# Patient Record
Sex: Female | Born: 1980 | Race: White | Hispanic: No | Marital: Married | Smoking: Never smoker
Health system: Southern US, Community
[De-identification: ages and names within clinical notes are randomized; demographics above are authoritative.]

## PROBLEM LIST (undated history)

## (undated) DIAGNOSIS — R519 Headache, unspecified: Secondary | ICD-10-CM

## (undated) DIAGNOSIS — F419 Anxiety disorder, unspecified: Secondary | ICD-10-CM

## (undated) DIAGNOSIS — R51 Headache: Secondary | ICD-10-CM

## (undated) HISTORY — PX: OTHER SURGICAL HISTORY: SHX169

## (undated) HISTORY — PX: EYE SURGERY: SHX253

---

## 2004-11-05 ENCOUNTER — Other Ambulatory Visit: Admission: RE | Admit: 2004-11-05 | Discharge: 2004-11-05 | Payer: Self-pay | Admitting: Obstetrics and Gynecology

## 2005-02-16 ENCOUNTER — Emergency Department (HOSPITAL_COMMUNITY): Admission: EM | Admit: 2005-02-16 | Discharge: 2005-02-16 | Payer: Self-pay | Admitting: Emergency Medicine

## 2005-12-07 ENCOUNTER — Other Ambulatory Visit: Admission: RE | Admit: 2005-12-07 | Discharge: 2005-12-07 | Payer: Self-pay | Admitting: Obstetrics and Gynecology

## 2013-12-04 ENCOUNTER — Ambulatory Visit: Payer: Self-pay | Admitting: Internal Medicine

## 2015-09-10 ENCOUNTER — Other Ambulatory Visit (HOSPITAL_COMMUNITY): Payer: Self-pay | Admitting: Obstetrics & Gynecology

## 2015-09-10 DIAGNOSIS — Z3169 Encounter for other general counseling and advice on procreation: Secondary | ICD-10-CM

## 2015-09-18 ENCOUNTER — Ambulatory Visit (HOSPITAL_COMMUNITY)
Admission: RE | Admit: 2015-09-18 | Discharge: 2015-09-18 | Disposition: A | Discharge status: Home / Self Care | Payer: 59 | Source: Ambulatory Visit | Attending: Obstetrics & Gynecology | Admitting: Obstetrics & Gynecology

## 2015-09-18 DIAGNOSIS — Z3169 Encounter for other general counseling and advice on procreation: Secondary | ICD-10-CM | POA: Diagnosis present

## 2015-09-18 MED ORDER — IOPAMIDOL (ISOVUE-300) INJECTION 61%
30.0000 mL | Freq: Once | INTRAVENOUS | Status: AC | PRN
  Administered 2015-09-18: 30 mL

## 2015-10-23 ENCOUNTER — Encounter (HOSPITAL_COMMUNITY): Payer: Self-pay | Admitting: *Deleted

## 2015-10-27 ENCOUNTER — Other Ambulatory Visit: Payer: Self-pay | Admitting: Obstetrics & Gynecology

## 2015-11-04 ENCOUNTER — Other Ambulatory Visit: Payer: Self-pay | Admitting: Obstetrics & Gynecology

## 2015-11-11 ENCOUNTER — Ambulatory Visit (HOSPITAL_COMMUNITY): Payer: 59 | Admitting: Anesthesiology

## 2015-11-11 ENCOUNTER — Ambulatory Visit (HOSPITAL_COMMUNITY)
Admission: RE | Admit: 2015-11-11 | Discharge: 2015-11-11 | Disposition: A | Discharge status: Home / Self Care | Payer: 59 | Source: Ambulatory Visit | Attending: Obstetrics & Gynecology | Admitting: Obstetrics & Gynecology

## 2015-11-11 ENCOUNTER — Encounter (HOSPITAL_COMMUNITY)
Admission: RE | Disposition: A | Discharge status: Home / Self Care | Payer: Self-pay | Source: Ambulatory Visit | Attending: Obstetrics & Gynecology

## 2015-11-11 ENCOUNTER — Encounter (HOSPITAL_COMMUNITY): Payer: Self-pay

## 2015-11-11 DIAGNOSIS — F419 Anxiety disorder, unspecified: Secondary | ICD-10-CM | POA: Insufficient documentation

## 2015-11-11 DIAGNOSIS — Z91040 Latex allergy status: Secondary | ICD-10-CM | POA: Insufficient documentation

## 2015-11-11 DIAGNOSIS — Q512 Other doubling of uterus: Secondary | ICD-10-CM | POA: Diagnosis not present

## 2015-11-11 DIAGNOSIS — N840 Polyp of corpus uteri: Secondary | ICD-10-CM | POA: Diagnosis present

## 2015-11-11 DIAGNOSIS — N979 Female infertility, unspecified: Secondary | ICD-10-CM | POA: Diagnosis not present

## 2015-11-11 DIAGNOSIS — Q513 Bicornate uterus: Secondary | ICD-10-CM | POA: Diagnosis not present

## 2015-11-11 DIAGNOSIS — Q51810 Arcuate uterus: Secondary | ICD-10-CM | POA: Insufficient documentation

## 2015-11-11 HISTORY — DX: Headache, unspecified: R51.9

## 2015-11-11 HISTORY — DX: Headache: R51

## 2015-11-11 HISTORY — DX: Anxiety disorder, unspecified: F41.9

## 2015-11-11 HISTORY — PX: DILATATION & CURETTAGE/HYSTEROSCOPY WITH MYOSURE: SHX6511

## 2015-11-11 LAB — CBC
HEMATOCRIT: 39.3 % (ref 36.0–46.0)
Hemoglobin: 13 g/dL (ref 12.0–15.0)
MCH: 28.8 pg (ref 26.0–34.0)
MCHC: 33.1 g/dL (ref 30.0–36.0)
MCV: 86.9 fL (ref 78.0–100.0)
PLATELETS: 254 10*3/uL (ref 150–400)
RBC: 4.52 MIL/uL (ref 3.87–5.11)
RDW: 12.5 % (ref 11.5–15.5)
WBC: 6.2 10*3/uL (ref 4.0–10.5)

## 2015-11-11 LAB — HCG, SERUM, QUALITATIVE: Preg, Serum: NEGATIVE

## 2015-11-11 LAB — PREGNANCY, URINE: PREG TEST UR: NEGATIVE

## 2015-11-11 SURGERY — DILATATION & CURETTAGE/HYSTEROSCOPY WITH MYOSURE
Anesthesia: General

## 2015-11-11 MED ORDER — FENTANYL CITRATE (PF) 100 MCG/2ML IJ SOLN
INTRAMUSCULAR | Status: AC
  Filled 2015-11-11: qty 2

## 2015-11-11 MED ORDER — LIDOCAINE HCL 1 % IJ SOLN
INTRAMUSCULAR | Status: AC
  Filled 2015-11-11: qty 20

## 2015-11-11 MED ORDER — LIDOCAINE HCL (CARDIAC) 20 MG/ML IV SOLN
INTRAVENOUS | Status: AC
  Filled 2015-11-11: qty 5

## 2015-11-11 MED ORDER — ONDANSETRON HCL 4 MG/2ML IJ SOLN
INTRAMUSCULAR | Status: AC
  Filled 2015-11-11: qty 2

## 2015-11-11 MED ORDER — LIDOCAINE HCL (CARDIAC) 20 MG/ML IV SOLN
INTRAVENOUS | Status: DC | PRN
  Administered 2015-11-11: 70 mg via INTRAVENOUS
  Administered 2015-11-11: 30 mg via INTRAVENOUS

## 2015-11-11 MED ORDER — LACTATED RINGERS IV SOLN
INTRAVENOUS | Status: DC
  Administered 2015-11-11: 14:00:00 via INTRAVENOUS
  Administered 2015-11-11: 125 mL/h via INTRAVENOUS

## 2015-11-11 MED ORDER — FLUMAZENIL 0.5 MG/5ML IV SOLN
INTRAVENOUS | Status: DC | PRN
  Administered 2015-11-11: 0.2 mg via INTRAVENOUS

## 2015-11-11 MED ORDER — SCOPOLAMINE 1 MG/3DAYS TD PT72
1.0000 | MEDICATED_PATCH | TRANSDERMAL | Status: DC
  Administered 2015-11-11: 1.5 mg via TRANSDERMAL

## 2015-11-11 MED ORDER — EPHEDRINE 5 MG/ML INJ
INTRAVENOUS | Status: AC
  Filled 2015-11-11: qty 10

## 2015-11-11 MED ORDER — IBUPROFEN 200 MG PO TABS
200.0000 mg | ORAL_TABLET | Freq: Four times a day (QID) | ORAL | Status: DC | PRN
  Filled 2015-11-11: qty 2

## 2015-11-11 MED ORDER — FENTANYL CITRATE (PF) 250 MCG/5ML IJ SOLN
INTRAMUSCULAR | Status: AC
  Filled 2015-11-11: qty 5

## 2015-11-11 MED ORDER — IBUPROFEN 100 MG/5ML PO SUSP
200.0000 mg | Freq: Four times a day (QID) | ORAL | Status: DC | PRN
  Filled 2015-11-11: qty 20

## 2015-11-11 MED ORDER — MIDAZOLAM HCL 2 MG/2ML IJ SOLN
INTRAMUSCULAR | Status: AC
  Filled 2015-11-11: qty 2

## 2015-11-11 MED ORDER — PROPOFOL 10 MG/ML IV BOLUS
INTRAVENOUS | Status: AC
  Filled 2015-11-11: qty 20

## 2015-11-11 MED ORDER — IBUPROFEN 200 MG PO TABS: 600.0000 mg | ORAL_TABLET | Freq: Four times a day (QID) | ORAL | Status: DC | PRN

## 2015-11-11 MED ORDER — FLUMAZENIL 0.5 MG/5ML IV SOLN
INTRAVENOUS | Status: AC
  Filled 2015-11-11: qty 5

## 2015-11-11 MED ORDER — HYDROCODONE-ACETAMINOPHEN 7.5-325 MG PO TABS
ORAL_TABLET | ORAL | Status: AC
  Filled 2015-11-11: qty 1

## 2015-11-11 MED ORDER — FENTANYL CITRATE (PF) 100 MCG/2ML IJ SOLN
INTRAMUSCULAR | Status: DC | PRN
  Administered 2015-11-11 (×4): 50 ug via INTRAVENOUS

## 2015-11-11 MED ORDER — KETOROLAC TROMETHAMINE 30 MG/ML IJ SOLN
INTRAMUSCULAR | Status: DC | PRN
  Administered 2015-11-11: 30 mg via INTRAVENOUS

## 2015-11-11 MED ORDER — FENTANYL CITRATE (PF) 100 MCG/2ML IJ SOLN
25.0000 ug | INTRAMUSCULAR | Status: DC | PRN
  Administered 2015-11-11: 50 ug via INTRAVENOUS

## 2015-11-11 MED ORDER — MIDAZOLAM HCL 2 MG/2ML IJ SOLN
INTRAMUSCULAR | Status: DC | PRN
  Administered 2015-11-11: 1 mg via INTRAVENOUS

## 2015-11-11 MED ORDER — LIDOCAINE HCL 1 % IJ SOLN
INTRAMUSCULAR | Status: DC | PRN
  Administered 2015-11-11: 20 mL

## 2015-11-11 MED ORDER — MEPERIDINE HCL 25 MG/ML IJ SOLN: 6.2500 mg | INTRAMUSCULAR | Status: DC | PRN

## 2015-11-11 MED ORDER — ONDANSETRON HCL 4 MG/2ML IJ SOLN: 4.0000 mg | Freq: Once | INTRAMUSCULAR | Status: DC | PRN

## 2015-11-11 MED ORDER — SCOPOLAMINE 1 MG/3DAYS TD PT72
MEDICATED_PATCH | TRANSDERMAL | Status: AC
  Administered 2015-11-11: 1.5 mg via TRANSDERMAL
  Filled 2015-11-11: qty 1

## 2015-11-11 MED ORDER — ONDANSETRON HCL 4 MG/2ML IJ SOLN
INTRAMUSCULAR | Status: DC | PRN
  Administered 2015-11-11: 4 mg via INTRAVENOUS

## 2015-11-11 MED ORDER — DEXAMETHASONE SODIUM PHOSPHATE 10 MG/ML IJ SOLN
INTRAMUSCULAR | Status: DC | PRN
  Administered 2015-11-11: 4 mg via INTRAVENOUS

## 2015-11-11 MED ORDER — EPHEDRINE SULFATE 50 MG/ML IJ SOLN
INTRAMUSCULAR | Status: DC | PRN
  Administered 2015-11-11: 10 mg via INTRAVENOUS
  Administered 2015-11-11: 15 mg via INTRAVENOUS
  Administered 2015-11-11: 10 mg via INTRAVENOUS

## 2015-11-11 MED ORDER — PROPOFOL 10 MG/ML IV BOLUS
INTRAVENOUS | Status: DC | PRN
  Administered 2015-11-11: 180 mg via INTRAVENOUS

## 2015-11-11 MED ORDER — SODIUM CHLORIDE 0.9 % IR SOLN
Status: DC | PRN
  Administered 2015-11-11: 3000 mL

## 2015-11-11 MED ORDER — KETOROLAC TROMETHAMINE 30 MG/ML IJ SOLN: 30.0000 mg | Freq: Once | INTRAMUSCULAR | Status: DC

## 2015-11-11 MED ORDER — HYDROCODONE-ACETAMINOPHEN 7.5-325 MG PO TABS
1.0000 | ORAL_TABLET | Freq: Once | ORAL | Status: AC | PRN
  Administered 2015-11-11: 1 via ORAL

## 2015-11-11 SURGICAL SUPPLY — 18 items
CANISTER SUCT 3000ML (MISCELLANEOUS) ×2 IMPLANT
CATH ROBINSON RED A/P 16FR (CATHETERS) ×2 IMPLANT
CLOTH BEACON ORANGE TIMEOUT ST (SAFETY) ×2 IMPLANT
CONTAINER PREFILL 10% NBF 60ML (FORM) ×4 IMPLANT
DEVICE MYOSURE LITE (MISCELLANEOUS) IMPLANT
DEVICE MYOSURE REACH (MISCELLANEOUS) ×1 IMPLANT
FILTER ARTHROSCOPY CONVERTOR (FILTER) ×2 IMPLANT
GLOVE BIO SURGEON STRL SZ7 (GLOVE) ×2 IMPLANT
GLOVE BIOGEL PI IND STRL 7.0 (GLOVE) ×3 IMPLANT
GLOVE BIOGEL PI INDICATOR 7.0 (GLOVE) ×3
GOWN STRL REUS W/TWL LRG LVL3 (GOWN DISPOSABLE) ×4 IMPLANT
PACK VAGINAL MINOR WOMEN LF (CUSTOM PROCEDURE TRAY) ×2 IMPLANT
PAD OB MATERNITY 4.3X12.25 (PERSONAL CARE ITEMS) ×2 IMPLANT
SEAL ROD LENS SCOPE MYOSURE (ABLATOR) ×2 IMPLANT
TOWEL OR 17X24 6PK STRL BLUE (TOWEL DISPOSABLE) ×4 IMPLANT
TUBING AQUILEX INFLOW (TUBING) ×2 IMPLANT
TUBING AQUILEX OUTFLOW (TUBING) ×2 IMPLANT
WATER STERILE IRR 1000ML POUR (IV SOLUTION) ×2 IMPLANT

## 2015-11-11 NOTE — Op Note (Signed)
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 Patient was counseled on risks/ complications including infection, bleeding, damage to internal organs, she understood and agrees, gave informed written consent.  Patient was brought to the operating room with IV running. Time out was carried out. She received preop 1 gm Ancef. She underwent general anesthesia via LMA without complications. She was given dorsolithotomy position. Parts were prepped and draped in standard fashion. Bladder was catheterized once. Bimanual exam revealed uterus to be anteverted and normal size. Speculum was placed and cervix was grasped with single-tooth tenaculum. Cervical block with 20 cc 1% plain Xylocaine given. The uterus was sounded to 7 cm. Cervical os was dilated to 21 [place] dilator. Myosure Hysteroscope was introduced in the uterine cavity under vision, using saline for irrigation.  Findings: Lower segment and left mid cavity lateral polyp. 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 Procedure well tolerated. Specimen sent to path. Hysteroscope and all instruments removed. Silver nitrate applied at tenaculum site.  Fluid deficit 280 cc.    All counts are correct x2. Patient awoke from anesthesia and brought to the recovery room stable.   Discharge from PACU, Follow up in 2 weeks in office. Warning signs of infection and excessive bleeding reviewed.   I performed the surgery. V.Lee-Ann Gal, MD  V.Benjie Karvonen, MD.

## 2015-11-11 NOTE — Discharge Instructions (Addendum)
DISCHARGE INSTRUCTIONS: HYSTEROSCOPY / ENDOMETRIAL ABLATION The following instructions have been prepared to help you care for yourself upon your return home.  May Remove Scop patch on or before: Friday November 14, 2015   May take Ibuprofen after: 7:50  May take stool softner while taking narcotic pain medication to prevent constipation.  Drink plenty of water.  Personal hygiene:  Use sanitary pads for vaginal drainage, not tampons.  Shower the day after your procedure.  NO tub baths, pools or Jacuzzis for 2-3 weeks.  Wipe front to back after using the bathroom.  Activity and limitations:  Do NOT drive or operate any equipment for 24 hours. The effects of anesthesia are still present and drowsiness may result.  Do NOT rest in bed all day.  Walking is encouraged.  Walk up and down stairs slowly.  You may resume your normal activity in one to two days or as indicated by your physician. Sexual activity: NO intercourse for at least 2 weeks after the procedure, or as indicated by your Doctor.  Diet: Eat a light meal as desired this evening. You may resume your usual diet tomorrow.  Return to Work: You may resume your work activities in one to two days or as indicated by Marine scientist.  What to expect after your surgery: Expect to have vaginal bleeding/discharge for 2-3 days and spotting for up to 10 days. It is not unusual to have soreness for up to 1-2 weeks. You may have a slight burning sensation when you urinate for the first day. Mild cramps may continue for a couple of days. You may have a regular period in 2-6 weeks.  Call your doctor for any of the following:  Excessive vaginal bleeding or clotting, saturating and changing one pad every hour.  Inability to urinate 6 hours after discharge from hospital.  Pain not relieved by pain medication.  Fever of 100.4 F or greater.  Unusual vaginal discharge or odor.  Return to office _________________Call for an appointment  ___________________ Patients signature: ______________________ Nurses signature ________________________  Post Anesthesia Care Unit [phone] Post Anesthesia Home Care Instructions  Activity: Get plenty of rest for the remainder of the day. A responsible adult should stay with you for 24 hours following the procedure.  For the next 24 hours, DO NOT: -Drive a car -Paediatric nurse -Drink alcoholic beverages -Take any medication unless instructed by your physician -Make any legal decisions or sign important papers.  Meals: Start with liquid foods such as gelatin or soup. Progress to regular foods as tolerated. Avoid greasy, spicy, heavy foods. If nausea and/or vomiting occur, drink only clear liquids until the nausea and/or vomiting subsides. Call your physician if vomiting continues.  Special Instructions/Symptoms: Your throat may feel dry or sore from the anesthesia or the breathing tube placed in your throat during surgery. If this causes discomfort, gargle with warm salt water. The discomfort should disappear within 24 hours.  If you had a scopolamine patch placed behind your ear for the management of post- operative nausea and/or vomiting:  1. The medication in the patch is effective for 72 hours, after which it should be removed.  Wrap patch in a tissue and discard in the trash. Wash hands thoroughly with soap and water. 2. You may remove the patch earlier than 72 hours if you experience unpleasant side effects which may include dry mouth, dizziness or visual disturbances. 3. Avoid touching the patch. Wash your hands with soap and water after contact with the patch.

## 2015-11-11 NOTE — Anesthesia Preprocedure Evaluation (Signed)
Anesthesia Evaluation  Patient identified by MRN, date of birth, ID band Patient awake    Reviewed: Allergy & Precautions, H&P , NPO status , Patient's Chart, lab work & pertinent test results  Airway Mallampati: I  TM Distance: >3 FB Neck ROM: full    Dental  (+) Teeth Intact   Pulmonary neg pulmonary ROS,    breath sounds clear to auscultation       Cardiovascular negative cardio ROS Normal cardiovascular exam     Neuro/Psych    GI/Hepatic negative GI ROS, Neg liver ROS,   Endo/Other  negative endocrine ROS  Renal/GU negative Renal ROS     Musculoskeletal   Abdominal Normal abdominal exam  (+)   Peds  Hematology negative hematology ROS (+)   Anesthesia Other Findings   Reproductive/Obstetrics negative OB ROS                             Anesthesia Physical Anesthesia Plan  ASA: II  Anesthesia Plan: General   Post-op Pain Management:    Induction: Intravenous  Airway Management Planned: LMA  Additional Equipment:   Intra-op Plan:   Post-operative Plan:   Informed Consent: I have reviewed the patients History and Physical, chart, labs and discussed the procedure including the risks, benefits and alternatives for the proposed anesthesia with the patient or authorized representative who has indicated his/her understanding and acceptance.     Plan Discussed with: CRNA and Surgeon  Anesthesia Plan Comments:         Anesthesia Quick Evaluation

## 2015-11-11 NOTE — H&P (Signed)
Lindsay Garza is an 35 y.o. female here for endometrial polypectomy.  Patient has infertility. Office work-up noted endometrial mass likely a polyp so recommended removal.  Normal Pap hx. No breast complaints.   Patient's last menstrual period was 10/08/2015 (exact date).    Past Medical History  Diagnosis Date  . Anxiety   . Headache     otc med prn    Past Surgical History  Procedure Laterality Date  . Urethra surgery      stretched  . Eye surgery Right     weak musle    History reviewed. No pertinent family history.  Social History:  reports that she has never smoked. She has never used smokeless tobacco. She reports that she drinks alcohol. She reports that she does not use illicit drugs.  Allergies:  Allergies  Allergen Reactions  . Latex Rash    Itching    Prescriptions prior to admission  Medication Sig Dispense Refill Last Dose  . naproxen (NAPROSYN) 500 MG tablet Take 500 mg by mouth daily as needed for moderate pain (cramps).    Past Week at Unknown time  . OVER THE COUNTER MEDICATION Take 2 each by mouth daily. Vita Fusion Gummies - 2 gummies daily   11/10/2015 at Unknown time  . clonazePAM (KLONOPIN) 0.5 MG tablet Take 0.5 mg by mouth as needed for anxiety.   More than a month at Unknown time    ROS neg  Blood pressure 102/74, pulse 72, temperature 98.2 F (36.8 C), temperature source Oral, resp. rate 20, height 5\' 5"  (1.651 m), weight 128 lb (58.06 kg), last menstrual period 10/08/2015, SpO2 100 %. Physical Exam Physical exam:  A&O x 3, no acute distress. Pleasant HEENT neg, no thyromegaly Lungs CTA bilat CV RRR, S1S2 normal Abdo soft, non tender, non acute Extr no edema/ tenderness Pelvic normal uterus and cervix.    Results for orders placed or performed during the hospital encounter of 11/11/15 (from the past 24 hour(s))  Pregnancy, urine     Status: None   Collection Time: 11/11/15 11:45 AM  Result Value Ref Range   Preg Test, Ur NEGATIVE  NEGATIVE  CBC     Status: None   Collection Time: 11/11/15 12:05 PM  Result Value Ref Range   WBC 6.2 4.0 - 10.5 K/uL   RBC 4.52 3.87 - 5.11 MIL/uL   Hemoglobin 13.0 12.0 - 15.0 g/dL   HCT 39.3 36.0 - 46.0 %   MCV 86.9 78.0 - 100.0 fL   MCH 28.8 26.0 - 34.0 pg   MCHC 33.1 30.0 - 36.0 g/dL   RDW 12.5 11.5 - 15.5 %   Platelets 254 150 - 400 K/uL    No results found.  Assessment/Plan: 35 yo, G0. Here for hysteroscopic polypectomy.  Risks/complications of surgery reviewed incl infection, bleeding, damage to uterus and scarring, damage to internal organs including bladder, bowels, ureters, blood vessels, other risks from anesthesia, VTE and delayed complications of any surgery, complications in future surgery reviewed.  UPT neg but pre-menses now Needs SPT- stat.   Lindsay Garza R 11/11/2015, 12:40 PM

## 2015-11-11 NOTE — Anesthesia Procedure Notes (Signed)
Procedure Name: LMA Insertion Date/Time: 11/11/2015 1:40 PM Performed by: Tobin Chad Pre-anesthesia Checklist: Patient identified, Timeout performed, Emergency Drugs available, Suction available and Patient being monitored Patient Re-evaluated:Patient Re-evaluated prior to inductionOxygen Delivery Method: Circle system utilized and Simple face mask Preoxygenation: Pre-oxygenation with 100% oxygen Intubation Type: IV induction Ventilation: Mask ventilation without difficulty LMA: LMA inserted LMA Size: 4.0 Grade View: Grade II Tube type: Oral Number of attempts: 1 Placement Confirmation: positive ETCO2 and CO2 detector Dental Injury: Teeth and Oropharynx as per pre-operative assessment

## 2015-11-11 NOTE — Transfer of Care (Signed)
Immediate Anesthesia Transfer of Care Note  Patient: Lindsay Garza  Procedure(s) Performed: Procedure(s): DILATATION & CURETTAGE/HYSTEROSCOPY WITH MYOSURE (N/A)  Patient Location: PACU  Anesthesia Type:General  Level of Consciousness: awake, alert , oriented and patient cooperative  Airway & Oxygen Therapy: Patient Spontanous Breathing and Patient connected to nasal cannula oxygen  Post-op Assessment: Report given to RN and Post -op Vital signs reviewed and stable  Post vital signs: Reviewed and stable  Last Vitals:  Filed Vitals:   11/11/15 1200  BP: 102/74  Pulse: 72  Temp: 36.8 C  Resp: 20    Last Pain: There were no vitals filed for this visit.    Patients Stated Pain Goal: 3 ([id number])  Complications: No apparent anesthesia complications

## 2015-11-12 ENCOUNTER — Encounter (HOSPITAL_COMMUNITY): Payer: Self-pay | Admitting: Obstetrics & Gynecology

## 2015-11-12 NOTE — Anesthesia Postprocedure Evaluation (Signed)
Anesthesia Post Note  Patient: Lindsay Garza  Procedure(s) Performed: Procedure(s) (LRB): DILATATION & CURETTAGE/HYSTEROSCOPY WITH MYOSURE (N/A)  Patient location during evaluation: PACU Anesthesia Type: General Level of consciousness: awake Pain management: pain level controlled Vital Signs Assessment: post-procedure vital signs reviewed and stable Respiratory status: spontaneous breathing Cardiovascular status: stable Postop Assessment: no signs of nausea or vomiting Anesthetic complications: no     Last Vitals:  Filed Vitals:   11/11/15 1500 11/11/15 1515  BP: 109/63   Pulse: 81   Temp:  36.8 C  Resp: 18     Last Pain:  Filed Vitals:   11/11/15 1521  PainSc: 3    Pain Goal: Patients Stated Pain Goal: 3 (11/11/15 1500)               Genever Hentges JR,JOHN Kavonte Bearse

## 2016-06-07 NOTE — L&D Delivery Note (Signed)
Delivery Note  First Stage: Labor onset: 11/28/16 @ 2100 Augmentation : none Analgesia Lorriane Shire intrapartum: Nitrous oxide, hydrotherapy SROM at 0544 am - clear fluid  Second Stage: Complete dilation at 0555 Onset of pushing at 0555 Intermittent fetal monitoring performed with FHR in 150s bpm before, during, and after contraction At crowning, occasional FHR 105-115 bpm with good return to 150s. Positive scalp stimulation.  In the tub at: 0520am Water temp at entry: 99.64F  Delivery of the fetal head noted at 0725am, at that time uterine tone inadequate with spaced contractions resulting in inability to deliver the shoulders.  McRoberts performed in the water at 0726am.  Gaskin maneuver in the water at 0727am without relief of the shoulders.  Pt. Moved from birth tub to the bed, and McRoberts and Suprapubic pressure performed by me. NICU/Code APGAR called at time -0726am with immediate arrival and evaluation after delivery. Dr. Murrell Redden, supervising physician, notified.   Delivery of a viable female at 6am by Derrell Lolling, CNM/Madailein Londo, CNM in LOA position No nuchal cord Cord double clamped 30 seconds after delivery by CNM Cord blood sample collected  Cord pH collected  Third Stage: Placenta delivered via Delena Bali then Damita Dunnings intact with 3 VC @ 0733am Membranes teased out with ring forceps  Placenta disposition: hospital disposal Uterine tone firm with massage / bleeding moderate - IM Pitocin given with good reponse  1st degree perineal laceration identified  Peri-clitoral laceration - hemostatic, not repaired Anesthesia for repair: 1 % Lidocaine 55mL Repair: 3.0 vicryl  Est. Blood Loss (mL): 102TR  Complications: shoulder dytsocia   Mom to postpartum.  Baby to Couplet care / Skin to Skin.  Newborn: Birth Weight: 8#4.5oz Apgar Scores: 8, 9 Feeding planned: Breast Cord pH: 7.2  Lars Pinks, MSN, CNM [facility]

## 2016-11-09 LAB — OB RESULTS CONSOLE GBS: GBS: NEGATIVE

## 2016-11-29 ENCOUNTER — Encounter (HOSPITAL_COMMUNITY): Payer: Self-pay | Admitting: *Deleted

## 2016-11-29 ENCOUNTER — Inpatient Hospital Stay (HOSPITAL_COMMUNITY)
Admission: AD | Admit: 2016-11-29 | Discharge: 2016-12-01 | DRG: 775 | Disposition: A | Discharge status: Home / Self Care | Payer: 59 | Source: Ambulatory Visit | Attending: Obstetrics & Gynecology | Admitting: Obstetrics & Gynecology

## 2016-11-29 DIAGNOSIS — O3413 Maternal care for benign tumor of corpus uteri, third trimester: Secondary | ICD-10-CM | POA: Diagnosis not present

## 2016-11-29 DIAGNOSIS — O9081 Anemia of the puerperium: Secondary | ICD-10-CM | POA: Diagnosis not present

## 2016-11-29 DIAGNOSIS — I959 Hypotension, unspecified: Secondary | ICD-10-CM | POA: Diagnosis not present

## 2016-11-29 DIAGNOSIS — Z3A39 39 weeks gestation of pregnancy: Secondary | ICD-10-CM | POA: Diagnosis not present

## 2016-11-29 DIAGNOSIS — O2686 Pruritic urticarial papules and plaques of pregnancy (PUPPP): Secondary | ICD-10-CM | POA: Diagnosis present

## 2016-11-29 DIAGNOSIS — D259 Leiomyoma of uterus, unspecified: Secondary | ICD-10-CM | POA: Diagnosis present

## 2016-11-29 DIAGNOSIS — O3403 Maternal care for unspecified congenital malformation of uterus, third trimester: Secondary | ICD-10-CM | POA: Diagnosis present

## 2016-11-29 DIAGNOSIS — O99719 Diseases of the skin and subcutaneous tissue complicating pregnancy, unspecified trimester: Secondary | ICD-10-CM | POA: Diagnosis present

## 2016-11-29 DIAGNOSIS — D62 Acute posthemorrhagic anemia: Secondary | ICD-10-CM | POA: Diagnosis not present

## 2016-11-29 DIAGNOSIS — Z3493 Encounter for supervision of normal pregnancy, unspecified, third trimester: Secondary | ICD-10-CM | POA: Diagnosis not present

## 2016-11-29 DIAGNOSIS — L309 Dermatitis, unspecified: Secondary | ICD-10-CM | POA: Diagnosis present

## 2016-11-29 DIAGNOSIS — Q512 Other doubling of uterus: Secondary | ICD-10-CM | POA: Diagnosis not present

## 2016-11-29 LAB — CBC
HEMATOCRIT: 41 % (ref 36.0–46.0)
HEMOGLOBIN: 14.4 g/dL (ref 12.0–15.0)
MCH: 32.4 pg (ref 26.0–34.0)
MCHC: 35.1 g/dL (ref 30.0–36.0)
MCV: 92.3 fL (ref 78.0–100.0)
PLATELETS: 184 10*3/uL (ref 150–400)
RBC: 4.44 MIL/uL (ref 3.87–5.11)
RDW: 13 % (ref 11.5–15.5)
WBC: 14.6 10*3/uL — ABNORMAL HIGH (ref 4.0–10.5)

## 2016-11-29 LAB — ABO/RH: ABO/RH(D): A POS

## 2016-11-29 LAB — TYPE AND SCREEN
ABO/RH(D): A POS
Antibody Screen: NEGATIVE

## 2016-11-29 LAB — RPR: RPR Ser Ql: NONREACTIVE

## 2016-11-29 LAB — AMNISURE RUPTURE OF MEMBRANE (ROM) NOT AT ARMC: Amnisure ROM: POSITIVE

## 2016-11-29 LAB — POCT FERN TEST: POCT FERN TEST: NEGATIVE

## 2016-11-29 MED ORDER — LIDOCAINE HCL (PF) 1 % IJ SOLN
30.0000 mL | INTRAMUSCULAR | Status: DC | PRN
  Administered 2016-11-29: 30 mL via SUBCUTANEOUS
  Filled 2016-11-29: qty 30

## 2016-11-29 MED ORDER — ZOLPIDEM TARTRATE 5 MG PO TABS: 5.0000 mg | ORAL_TABLET | Freq: Every evening | ORAL | Status: DC | PRN

## 2016-11-29 MED ORDER — ONDANSETRON HCL 4 MG/2ML IJ SOLN: 4.0000 mg | INTRAMUSCULAR | Status: DC | PRN

## 2016-11-29 MED ORDER — LACTATED RINGERS IV SOLN: 500.0000 mL | INTRAVENOUS | Status: DC | PRN

## 2016-11-29 MED ORDER — IBUPROFEN 600 MG PO TABS
600.0000 mg | ORAL_TABLET | Freq: Four times a day (QID) | ORAL | Status: DC
  Administered 2016-11-29 – 2016-11-30 (×7): 600 mg via ORAL
  Filled 2016-11-29 (×7): qty 1

## 2016-11-29 MED ORDER — SOD CITRATE-CITRIC ACID 500-334 MG/5ML PO SOLN: 30.0000 mL | ORAL | Status: DC | PRN

## 2016-11-29 MED ORDER — LACTATED RINGERS IV SOLN: INTRAVENOUS | Status: DC

## 2016-11-29 MED ORDER — IBUPROFEN 600 MG PO TABS: 600.0000 mg | ORAL_TABLET | Freq: Four times a day (QID) | ORAL | Status: DC

## 2016-11-29 MED ORDER — WITCH HAZEL-GLYCERIN EX PADS: 1.0000 "application " | MEDICATED_PAD | CUTANEOUS | Status: DC | PRN

## 2016-11-29 MED ORDER — DIPHENHYDRAMINE HCL 25 MG PO CAPS
25.0000 mg | ORAL_CAPSULE | Freq: Four times a day (QID) | ORAL | Status: DC | PRN
  Administered 2016-11-30 – 2016-12-01 (×2): 25 mg via ORAL
  Filled 2016-11-29 (×2): qty 1

## 2016-11-29 MED ORDER — ACETAMINOPHEN 325 MG PO TABS
650.0000 mg | ORAL_TABLET | ORAL | Status: DC | PRN
  Administered 2016-11-29 – 2016-12-01 (×5): 650 mg via ORAL
  Filled 2016-11-29 (×5): qty 2

## 2016-11-29 MED ORDER — PRENATAL MULTIVITAMIN CH
1.0000 | ORAL_TABLET | Freq: Every day | ORAL | Status: DC
  Administered 2016-11-30 – 2016-12-01 (×2): 1 via ORAL
  Filled 2016-11-29 (×2): qty 1

## 2016-11-29 MED ORDER — DIBUCAINE 1 % RE OINT: 1.0000 "application " | TOPICAL_OINTMENT | RECTAL | Status: DC | PRN

## 2016-11-29 MED ORDER — ACETAMINOPHEN 325 MG PO TABS: 650.0000 mg | ORAL_TABLET | ORAL | Status: DC | PRN

## 2016-11-29 MED ORDER — OXYCODONE-ACETAMINOPHEN 5-325 MG PO TABS: 2.0000 | ORAL_TABLET | ORAL | Status: DC | PRN

## 2016-11-29 MED ORDER — SIMETHICONE 80 MG PO CHEW: 80.0000 mg | CHEWABLE_TABLET | ORAL | Status: DC | PRN

## 2016-11-29 MED ORDER — OXYCODONE-ACETAMINOPHEN 5-325 MG PO TABS: 1.0000 | ORAL_TABLET | ORAL | Status: DC | PRN

## 2016-11-29 MED ORDER — OXYTOCIN 10 UNIT/ML IJ SOLN
INTRAMUSCULAR | Status: AC
  Administered 2016-11-29: 10 [IU]
  Filled 2016-11-29: qty 1

## 2016-11-29 MED ORDER — OXYTOCIN BOLUS FROM INFUSION: 500.0000 mL | Freq: Once | INTRAVENOUS | Status: DC

## 2016-11-29 MED ORDER — COCONUT OIL OIL: 1.0000 "application " | TOPICAL_OIL | Status: DC | PRN

## 2016-11-29 MED ORDER — OXYTOCIN 40 UNITS IN LACTATED RINGERS INFUSION - SIMPLE MED: 2.5000 [IU]/h | INTRAVENOUS | Status: DC

## 2016-11-29 MED ORDER — BENZOCAINE-MENTHOL 20-0.5 % EX AERO
1.0000 "application " | INHALATION_SPRAY | CUTANEOUS | Status: DC | PRN
  Administered 2016-11-29 – 2016-12-01 (×2): 1 via TOPICAL
  Filled 2016-11-29 (×2): qty 56

## 2016-11-29 MED ORDER — ONDANSETRON HCL 4 MG PO TABS: 4.0000 mg | ORAL_TABLET | ORAL | Status: DC | PRN

## 2016-11-29 MED ORDER — SENNOSIDES-DOCUSATE SODIUM 8.6-50 MG PO TABS
2.0000 | ORAL_TABLET | ORAL | Status: DC
  Administered 2016-11-30 (×2): 2 via ORAL
  Filled 2016-11-29 (×2): qty 2

## 2016-11-29 MED ORDER — FLEET ENEMA 7-19 GM/118ML RE ENEM: 1.0000 | ENEMA | RECTAL | Status: DC | PRN

## 2016-11-29 MED ORDER — ONDANSETRON HCL 4 MG/2ML IJ SOLN: 4.0000 mg | Freq: Four times a day (QID) | INTRAMUSCULAR | Status: DC | PRN

## 2016-11-29 NOTE — Consult Note (Signed)
Neonatology Note:  Attendance at Code Apgar:   Our team responded to a Code Apgar call to room # 160 following NSVD, due to infant with apnea. The requesting physician was Derrell Lolling, CNM. The mother is a G1 , GBS neg with good PNC. ROM occurred 2 hours PTD and the fluid was clear.  Water birth.  At delivery, the baby was with shoulder dystocia that lasted ~2 min staff and presented with . The OB nursing staff in attendance gave vigorous stimulation and a Code Apgar was called. Our team arrived just prior to 1 minute of life, at which time the baby was blue, apneic and floppy.  HR >100. Great response to stimulation with normalization of transition. Ap 8/9. Clavicles intact.  I spoke with the parents in the DR, then transferred the baby to the Pediatrician's care.   Please do not hesitate in contacting us if further concerns.   Monia Sabal Katherina Mires, MD

## 2016-11-29 NOTE — MAU Note (Signed)
Routine admission verbal orders given, as well nitrous order per CNM. Amnisure collected and sent to lab. Fetal heart rate not tracing at times-discussed with CNM-pt very uncomfortable and cannot sit with contractions. Pt does not need IV at this time per CNM. Lab called to collect routine admission labs.

## 2016-11-29 NOTE — Progress Notes (Signed)
S: In water tub at 0520, now w/ urge to bear down.   O: VSSAF  FHR 150's, no audible decels intermittent auscultation Ctx q 2-3 min palp  SVE 9/100/0, BBOW + show.   A/P: Transition labor FHT cat 1  Anticipate NSVB / waterbirth  Juliene Pina, MSN, CNM 11/29/2016, 5:37 AM

## 2016-11-29 NOTE — H&P (Signed)
OB ADMISSION/ HISTORY & PHYSICAL:  Admission Date: 11/29/2016  1:29 AM  Admit Diagnosis: Term pregnancy in labor  Lindsay Garza is a 36 y.o. female presenting for painful ctx started at 41. + small show, minimal vaginal DC, + FM. Plans waterbirth. Nausea present, no emesis. 3cm on admit.  Prenatal History: G1P0   EDC : 12/03/2016, by [facility]  Prenatal care at [facility] since 1st trim  Prenatal course complicated by  1) Uterine fibroid - R lateral 4.5 cm, stable 2) Femara conception 3) Partial septate uterus, hx endometrial polypectomy 2017   Prenatal Labs: ABO, Rh:   A pos Antibody:  neg Rubella:   immune RPR:   NR HBsAg:   Neg HIV:   Neg GBS: Negative (06/05 0000)  1 hr Glucola : 133 Informaseq and AFP1 wnl  Normal female anatomy, EIF perisiting at 23 wks Growth sono [redacted]w[redacted]d 70%tile, AFI 18, fibroid stable   Medical / Surgical History :  Past medical history:  Past Medical History:  Diagnosis Date  . Anxiety   . Headache    otc med prn     Past surgical history:  Past Surgical History:  Procedure Laterality Date  . DILATATION & CURETTAGE/HYSTEROSCOPY WITH MYOSURE N/A 11/11/2015   Procedure: DILATATION & CURETTAGE/HYSTEROSCOPY WITH MYOSURE;  Surgeon: Azucena Fallen, MD;  Location: [facility];  Service: Gynecology;  Laterality: N/A;  . EYE SURGERY Right    weak musle  . urethra surgery     stretched     Family History: History reviewed. No pertinent family history.   Social History:  reports that she has never smoked. She has never used smokeless tobacco. She reports that she drinks alcohol. She reports that she does not use drugs.   Allergies: Latex    Current Medications at time of admission:  Prescriptions Prior to Admission  Medication Sig Dispense Refill Last Dose  . clonazePAM (KLONOPIN) 0.5 MG tablet Take 0.5 mg by mouth as needed for anxiety.   More than a month at Unknown time  . ibuprofen (ADVIL) 200 MG tablet Take 3 tablets  (600 mg total) by mouth every 6 (six) hours as needed. 30 tablet 0   . naproxen (NAPROSYN) 500 MG tablet Take 500 mg by mouth daily as needed for moderate pain (cramps).    Past Week at Unknown time  . OVER THE COUNTER MEDICATION Take 2 each by mouth daily. Vita Fusion Gummies - 2 gummies daily   11/10/2015 at Unknown time      Review of Systems: ROS  As noted above.    Physical Exam:  Dilation: 6 Effacement (%): 100 Station: 0 Exam by:: Avanni Turnbaugh,CNM Vitals:   11/29/16 0137 11/29/16 0143 11/29/16 0250 11/29/16 0253  BP:  116/63 (!) 90/54 108/78  Pulse:  73 (!) 110 72  Resp:  20  20  Temp:  98.3 F (36.8 C)  98.4 F (36.9 C)  TempSrc:  Oral  Oral  SpO2:  100%    Weight: 75.3 kg (166 lb)   75.3 kg (166 lb)  Height: 5\' 6"  (1.676 m)   5\' 6"  (1.676 m)   General: AAO x 3, breathing w/ ctx, unsure if can labor natural, using Nitrous but makes her feel more nauseated Heart: RRR Lungs:CTAB Abdomen:gravid, NT Extremities:no edema Genitalia / VE: no lesions, + small show, BOW intact FHR: 140, mod var, + accels, no decels TOCO: ctx q 2-4 min, palp mod/strong  Labs:     Recent Labs  11/29/16 0301  WBC 14.6*  HGB 14.4  HCT 41.0  PLT 184     Assessment:  36 y.o. G1P0 at [redacted]w[redacted]d Active stage of labor with rapid progression Uterine Fibroid  GBS neg FHR category 1   Plan:  Admit to BS, routine orders Expectant management Labor support for unmedicated water labor / birth if desires Epidural PRN Dr Murrell Redden notified of admission / plan of care   [place], MSN, [facility] 11/29/2016, 3:45 AM

## 2016-11-29 NOTE — MAU Note (Signed)
Pt reports contractions every 1-2 mins. Denies LOF or vaginal bleeding. Reports good fetal movement. States cervix has not been examined in office.

## 2016-11-29 NOTE — Lactation Note (Signed)
This note was copied from a baby's chart. Lactation Consultation Note  Patient Name: Lindsay Garza [id number] Date: 11/29/2016 Reason for consult: Initial assessment Baby at 5 hr and parents were requesting lactation. Upon entry baby was cueing and mom was agreeable to latching. Mom has semi firm breast with short nipples. Baby has a recessed chin and is doing some tongue thrusting. Baby can latch for a few sucks. He was getting frustrated. Applied #20 NS and baby was able to maintain longer bursts of sucking. Discussed baby behavior, feeding frequency, baby belly size, voids, wt loss, breast changes, and nipple care. Demonstrated manual expression, large drops of colostrum noted bilaterally, spoon at the bedside. Given lactation handouts. Aware of OP services and support group. Mom will offer the breast on demand 8+/24hr, post express, and spoon feed.     Maternal Data Has patient been taught Hand Expression?: Yes Does the patient have breastfeeding experience prior to this delivery?: No  Feeding Feeding Type: Breast Fed  LATCH Score/Interventions Latch: Repeated attempts needed to sustain latch, nipple held in mouth throughout feeding, stimulation needed to elicit sucking reflex. Intervention(s): Adjust position;Assist with latch  Audible Swallowing: A few with stimulation Intervention(s): Hand expression;Skin to skin  Type of Nipple: Everted at rest and after stimulation  Comfort (Breast/Nipple): Soft / non-tender     Hold (Positioning): Full assist, staff holds infant at breast Intervention(s): Position options;Support Pillows  LATCH Score: 6  Lactation Tools Discussed/Used Tools: Nipple Shields Nipple shield size: 20   Consult Status Consult Status: Follow-up Date: 11/30/16 Follow-up type: In-patient    Lindsay Garza 11/29/2016, 12:38 PM

## 2016-11-30 LAB — CBC
HCT: 34 % — ABNORMAL LOW (ref 36.0–46.0)
Hemoglobin: 11.6 g/dL — ABNORMAL LOW (ref 12.0–15.0)
MCH: 32.3 pg (ref 26.0–34.0)
MCHC: 34.1 g/dL (ref 30.0–36.0)
MCV: 94.7 fL (ref 78.0–100.0)
PLATELETS: 172 10*3/uL (ref 150–400)
RBC: 3.59 MIL/uL — AB (ref 3.87–5.11)
RDW: 13 % (ref 11.5–15.5)
WBC: 13.9 10*3/uL — ABNORMAL HIGH (ref 4.0–10.5)

## 2016-11-30 MED ORDER — POLYSACCHARIDE IRON COMPLEX 150 MG PO CAPS
150.0000 mg | ORAL_CAPSULE | Freq: Every day | ORAL | Status: DC
  Administered 2016-11-30 – 2016-12-01 (×2): 150 mg via ORAL
  Filled 2016-11-30 (×2): qty 1

## 2016-11-30 MED ORDER — HYDROCORTISONE 0.5 % EX OINT
TOPICAL_OINTMENT | Freq: Two times a day (BID) | CUTANEOUS | Status: DC
  Filled 2016-11-30: qty 28.35

## 2016-11-30 MED ORDER — HYDROCORTISONE 1 % EX CREA
TOPICAL_CREAM | Freq: Two times a day (BID) | CUTANEOUS | Status: DC
  Administered 2016-11-30 (×2): via TOPICAL
  Filled 2016-11-30: qty 28

## 2016-11-30 MED ORDER — MAGNESIUM OXIDE 400 (241.3 MG) MG PO TABS
400.0000 mg | ORAL_TABLET | Freq: Every day | ORAL | Status: DC
  Administered 2016-11-30 – 2016-12-01 (×2): 400 mg via ORAL
  Filled 2016-11-30 (×3): qty 1

## 2016-11-30 NOTE — Lactation Note (Signed)
This note was copied from a baby's chart. Lactation Consultation Note Mom called out for lactation.  Infant in blankets being held.  Mom wanted LC to observe a feed.  LC encouraged mom to do STS and hand express.  Mom was able to easily express colostrum.  Mom attempted to latch infant without NS.  Infant latched after multiple attempts.  LC assisted in helping pull chin down while latched and flange lips more.  Swallows heard with massage and compression and good rhythmic jaw movement noted.  INfant fed for 10 minutes.  Mom attempted on other side but infant would not latch; NS placed and infant began feeding.  This time, lips were flanged and gape was wide.  Mom denied discomfort with feed.  Wet diaper noted after this feed.    LC reviewed feeding on demand and encouraged STS.  Mom encouraged to call out for assistance, concerns, or questions regarding feeding.      Patient Name: Lindsay Garza [id number] Date: 11/30/2016 Reason for consult: Follow-up assessment   Maternal Data    Feeding Feeding Type: Breast Fed Length of feed: 10 min  LATCH Score/Interventions Latch: Repeated attempts needed to sustain latch, nipple held in mouth throughout feeding, stimulation needed to elicit sucking reflex. Intervention(s): Adjust position  Audible Swallowing: A few with stimulation Intervention(s): Skin to skin;Hand expression  Type of Nipple: Everted at rest and after stimulation (taunt tissue)  Comfort (Breast/Nipple): Soft / non-tender     Hold (Positioning): Assistance needed to correctly position infant at breast and maintain latch. Intervention(s): Support Pillows;Breastfeeding basics reviewed;Position options;Skin to skin  LATCH Score: 7  Lactation Tools Discussed/Used Tools: Nipple Shields (20)   Consult Status Consult Status: Follow-up Date: 12/01/16 Follow-up type: In-patient    Ferne Coe [facility] 11/30/2016, 6:38 PM

## 2016-11-30 NOTE — Progress Notes (Addendum)
PPD #1, SVD complicated by shoulder dystocia; 1st degree perineal laceration, baby boy   S:  Reports feeling better today  Still having intense tailbone pain  Occasional dizziness and SOB with prolonged standing/activity - pt. States her normal BPs are usually 90s/60s  C/o pruritic rash on abdomen, hands, and feet - no allergens identified. Reports hx. Of PUPPS during pregnancy but feels over night it has spread to hands/feet              Tolerating po/ No nausea or vomiting             Bleeding is light             Pain controlled with Motrin and Tylenol             Up ad lib / ambulatory / voiding QS  Newborn breast feeding - cluster feeding now and latching well with nipple shield  / Circumcision - not planning  O:               VS: BP (!) 82/47 (BP Location: Left Arm)   Pulse 71   Temp 97.9 F (36.6 C) (Oral)   Resp 18   Ht 5\' 6"  (1.676 m)   Wt 75.3 kg (166 lb)   SpO2 100%   Breastfeeding? Unknown   BMI 26.79 kg/m    LABS:              Recent Labs  11/29/16 0301 11/30/16 0543  WBC 14.6* 13.9*  HGB 14.4 11.6*  PLT 184 172               Blood type: --/--/A POS, A POS (06/25 0301)  Rubella:        Immune               I&O: Intake/Output      06/25 0701 - 06/26 0700 06/26 0701 - 06/27 0700   Blood 350    Total Output 350     Net -350                        Physical Exam:             Alert and oriented X3  Lungs: Clear and unlabored  Heart: regular rate and rhythm / no mumurs  Abdomen: soft, non-tender, non-distended, striae present              Fundus: firm, non-tender, U-1, fibroid palpated on right lateral side  Perineum: well approximated 1st degree perineal laceration and peri-clitoral, no significant erythema or edema   Lochia: appropriate, no clots   Extremities: no edema, no calf pain or tenderness, macular, erthematous rash on hands, feet - irregular pattern, not diffuse     A: PPD # 1, SVD, baby boy  S/p shoulder dystocia  1st degree perineal  laceration    Hypotension - stable  Macular rash  Doing well - stable status  P: Routine post partum orders  Continue monitoring BPs every 4 hours   Encouraged to increase hydration, small frequent meals   Declined IV for fluid bolus  Niferex 150mg  daily  Mag Oxide 400mg  daily   Start Hydrocortisone ointment 0.5% BID for rash - use sparingly   See lactation today  Orthostatic vital signs today  Anticipate d/c home tomorrow  Lars Pinks, MSN, CNM [facility]

## 2016-11-30 NOTE — Progress Notes (Signed)
Patient did not want to get up to do orthostatics, she was too tired and about to fall asleep vitals taken at 11am. Denies syncope .

## 2016-11-30 NOTE — Progress Notes (Signed)
[facility] notified of orthostatics BP and the fact the patient is still complaining of itching in  Hands and feet. Will wait for orders.

## 2016-12-01 DIAGNOSIS — L309 Dermatitis, unspecified: Secondary | ICD-10-CM | POA: Diagnosis present

## 2016-12-01 DIAGNOSIS — O99719 Diseases of the skin and subcutaneous tissue complicating pregnancy, unspecified trimester: Secondary | ICD-10-CM | POA: Diagnosis present

## 2016-12-01 MED ORDER — POLYSACCHARIDE IRON COMPLEX 150 MG PO CAPS: 150.0000 mg | ORAL_CAPSULE | Freq: Every day | ORAL | 0 refills | Status: AC

## 2016-12-01 MED ORDER — TRIAMCINOLONE 0.1 % CREAM:EUCERIN CREAM 1:1: 1.0000 "application " | TOPICAL_CREAM | Freq: Three times a day (TID) | CUTANEOUS | Status: AC | PRN

## 2016-12-01 MED ORDER — CETIRIZINE HCL 10 MG PO TABS: 10.0000 mg | ORAL_TABLET | Freq: Every day | ORAL | 0 refills | Status: AC

## 2016-12-01 MED ORDER — IBUPROFEN 800 MG PO TABS: 800.0000 mg | ORAL_TABLET | Freq: Three times a day (TID) | ORAL | 0 refills | Status: AC | PRN

## 2016-12-01 MED ORDER — MAGNESIUM OXIDE 400 (241.3 MG) MG PO TABS: 400.0000 mg | ORAL_TABLET | Freq: Every day | ORAL | 0 refills | Status: AC

## 2016-12-01 NOTE — Discharge Summary (Signed)
Obstetric Discharge Summary  Reason for Admission: onset of labor Prenatal Procedures: none Intrapartum Procedures: water immersion - exit from tub with shoulder dystocia                                              spontaneous vaginal delivery with shoulder dystocia Postpartum Procedures: none Complications-Operative and Postpartum: 1st degree perineal laceration Hemoglobin  Date Value Ref Range Status  11/30/2016 11.6 (L) 12.0 - 15.0 g/dL Final   HCT  Date Value Ref Range Status  11/30/2016 34.0 (L) 36.0 - 46.0 % Final    Physical Exam:  General: cooperative, fatigued, no distress and pale Lochia: appropriate Uterine Fundus: firm Perineum: healing well DVT Evaluation: No evidence of DVT seen on physical exam.  Discharge Diagnoses: Term Pregnancy-delivered and PUPPS with mild ABL anemia  Discharge Information: Date: 12/01/2016 Activity: pelvic rest Diet: routine Medications: PNV, Ibuprofen, Iron and magnesium and Kenalog for PUPPS and zrytec Condition: stable Instructions: refer to practice specific booklet Discharge to: home Follow-up Information    Darliss Cheney, CNM. Schedule an appointment as soon as possible for a visit in 6 week(s).   Specialty:  Obstetrics and Gynecology Why:  call if rash is not improving in 1 week Contact information: [place] [address] [phone]           Newborn Data: Live born female  Birth Weight: 8 lb 4.5 oz (3755 g) APGAR: 8, 9  Home with mother.  Lindsay Garza 12/01/2016, 10:28 AM

## 2016-12-01 NOTE — Progress Notes (Signed)
CSW received consult for hx of Anxiety and Depression.  CSW met with MOB to offer support and complete assessment.   MOB reports feeling well physically and emotionally now and throughout pregnancy, however, she reports that she had some symptoms of anxiety and depression related to her mother's death about a year ago and due to her 37 year old nephew living with her recently.  She feels like she coped well then and is coping well now and thinks her feels were appropriate given the situation.  She reports no current concerns or needs.  MOB was engaged and receptive to education provided by CSW regarding the baby blues period vs. perinatal mood disorders.  CSW provided information about support groups offered at [facility] and recommends self-evaluation during the postpartum time period using the New Mom Checklist from Postpartum Progress and encouraged MOB to contact a medical professional if symptoms are noted at any time.  MOB agreed and thanked CSW for the visit. CSW identifies no further need for intervention and no barriers to discharge at this time. 

## 2016-12-01 NOTE — Lactation Note (Signed)
This note was copied from a baby's chart. Lactation Consultation Note  Patient Name: Lindsay Garza FAOZH'Y Date: 12/01/2016 Reason for consult: Follow-up assessment   Follow up assessment with first time mom of 82 hour old infant. Infant with 9 BF for 10-35 minutes, 3 voids and 0 stool in last 24 hours. Last stool 6/25 @ 2344. LATCH Scores 7-8. Infant weight 7 lb 12.5 oz with 6% weight loss since birth. Mom is using a # 16 NS with each feeding. Mom reports her breasts are feeling fuller today.   Mom had infant latched to right breast in the football hold with a # 16 NS in place. Mom had infant positioned and supported very well. Infant sleepy, discussed nutritive vs non nutritive suckling and importance of breast massage/compression and awakening techniques with feedings. Infant did have increased swallows with breast compression and awakening techniques. Infant fed for 8 minutes and then was removed from breast to awaken. # 16 NS was full of nipple tissue and appeared to be too small. Changed mom back to # 20 NS and infant latched and fed for about 10 minutes. He was noted to continue to need awakening techniques with feeding. Mom then burped him and he was noted to be rooting on her neck. She then applied the # 20 NS independently and latched infant to left breast in the cross cradle hold and infant noted to be more alert with increased swallows. Mom hand expressed and colostrum very easily expressible. Infant was still feeding when [place] left room.   Discussed with mom that infant has not stooled since the evening of the 25th, mom confirmed infant has not stooled recently. Discussed with mom that using a NS with each feeding has a potential for lowering her milk supply and that pumping 4-6 x a day to protect milk supply. Mom has a Medela Free Style pump at home. Mom wished to start pumping now, DEBP set up with instructions for use on Initiate setting, assembling, disassembling and cleaning of pump  parts. Mom is to pump after infant finished BF. Discussed pumping at least 4 -6 x a day for 15-20 minutes using hands on pumping techniques. Discussed that infant should be fed all EBM via spoon, syringe or bottle. Mom will need to be shown how to spoon and syringe feed. Did show mom how to prime NS with EBM prior to latch.   Advised mom to BF infant 8-12 x in 24 hours at first feeding cues using # 20 NS.  Supplement infant with available EBM Pump for 15-20 minutes with DEBP Hand express post pumping.   Reviewed with mom when to change up to # 24 NS. Mom has # 16 NS, 2 # 20 NS, and a # 24 NS to take home. OP LC appt made for Monday 7/2 @ 11:30 am. Appointment reminder given.   Mom is to call when infant awake next feeding to be shown how to spoon/syringe feed..        Maternal Data Formula Feeding for Exclusion: No Has patient been taught Hand Expression?: Yes Does the patient have breastfeeding experience prior to this delivery?: No  Feeding Feeding Type: Breast Fed Length of feed: 20 min  LATCH Score/Interventions Latch: Grasps breast easily, tongue down, lips flanged, rhythmical sucking. Intervention(s): Adjust position;Assist with latch;Breast massage;Breast compression  Audible Swallowing: A few with stimulation Intervention(s): Skin to skin;Hand expression;Alternate breast massage  Type of Nipple: Everted at rest and after stimulation  Comfort (Breast/Nipple): Soft / non-tender  Hold (Positioning): No assistance needed to correctly position infant at breast. Intervention(s): Breastfeeding basics reviewed;Support Pillows;Position options;Skin to skin  LATCH Score: 8  Lactation Tools Discussed/Used Tools: Nipple Shields Nipple shield size: 16;20 WIC Program: No Pump Review: Setup, frequency, and cleaning;Milk Storage Initiated by:: Nonah Mattes, RN, IBCLC Date initiated:: 12/01/16   Consult Status Consult Status: Follow-up Date: 12/06/16 Follow-up type:  Out-patient    Debby Freiberg Kylii Ennis 12/01/2016, 11:40 AM

## 2016-12-01 NOTE — Progress Notes (Signed)
PPD 2 SVD with shoulder dystocia   S:  Reports feeling sleepy but ready to go home to own bed              Tolerating po/ No nausea or vomiting             Bleeding is light             Pain controlled with ibuprofen (OTC)             Up ad lib / ambulatory / voiding QS  Newborn breast feeding  / Circumcision not planned   O:               VS: BP (!) 97/56 (BP Location: Left Arm)   Pulse 70   Temp 98.2 F (36.8 C) (Oral)   Resp 20   Ht 5\' 6"  (1.676 m)   Wt 75.3 kg (166 lb)   SpO2 100%   Breastfeeding? Unknown   BMI 26.79 kg/m    LABS:              Recent Labs  11/29/16 0301 11/30/16 0543  WBC 14.6* 13.9*  HGB 14.4 11.6*  PLT 184 172               Blood type: --/--/A POS, A POS (06/25 0301)  Rubella:                            Physical Exam:             Alert and oriented X3  Abdomen: soft, non-tender, non-distended              Fundus: firm, non-tender, U/2  Perineum: 1st degree laceration - healing well   Lochia: light   Extremities: no edema, no calf pain or tenderness    A: PPD # 2 s/p shoulder dystocia   Doing well - stable status  PUPPS   Mild ABL anemia- asymptomatic   P: DC home today   Iron and magnesium supplementation for ABL anemia  Kenalog cream for PUPPS, discussed need to use daily for next couple of weeks   [facility] packet discharge instructions given and reviewed   Follow up in office 6 weeks PP   Darrol Poke BSN, SNM 12/01/2016, 10:49 AM

## 2016-12-01 NOTE — Plan of Care (Signed)
Problem: Education: Goal: Knowledge of condition will improve Outcome: Completed/Met Date Met: 12/01/16 Discharge education, safety and follow reviewed with patient. Patient verbalizes understanding.

## 2016-12-06 ENCOUNTER — Ambulatory Visit: Payer: Self-pay

## 2016-12-06 NOTE — Lactation Note (Addendum)
This note was copied from a baby's chart. Lactation Consult  Mother's reason for visit:  Feeding Assessment, reduced stools, nipple shield use Visit Type:  Outpatient Appointment Notes:  Baby has had weight loss and has only had   Consult:  Initial Lactation Consultant:  Carlye Grippe  ________________________________________________________________________ Sharene Skeans Name:  Ardeen Fillers Date of Birth:  Mar 04, 1981 Pediatrician:  Sherryle Lis Gender:  female Gestational Age: <None> (At Birth) Birth Weight:    Weight at Discharge:  Weight: 2656 oz                      Date of Discharge:  12/01/2016     [facility] Weights   11/29/16 0137 11/29/16 0253  Weight: 2656 oz 2656 oz  Last weight taken from location outside of [facility]:  7 lb 9 oz      Location:Pediatrician's office Weight today:  7 lb 5.9 oz      ________________________________________________________________________  Mother's Name: Rudy Jew Penning Type of delivery:  Vaginal, Spontaneous Delivery Breastfeeding Experience:  primip Maternal Medical Conditions:  Polycystic ovarian syndrome (possible, treated with metformin to get pregnant) Maternal Medications:  Mag. Sulfate, PNV, motrin, zyrtec  [facility] Medications and Mother's Milk rates Zyrtec as a L2 probably compatible with breastfeeding.    ________________________________________________________________________  Breastfeeding History (Post Discharge)  Frequency of breastfeeding:  q 3 hours Duration of feeding:  45-1 hours    Pumping  Type of pump:  Medela pump in style Frequency:  After every day time feeding Volume:  17-20 ml  Infant Intake and Output Assessment  Voids:  3 in 24 hrs.  Color:  Dark yellow Stools:  2 in 7 days hrs.  Color:  Green  ________________________________________________________________________  Maternal Breast Assessment  Breast:  Soft Nipple:  Erect Pain level:  0 Pain interventions:  Expressed breast  milk  _______________________________________________________________________ Feeding Assessment/Evaluation  Initial feeding assessment:  Infant's oral assessment:  Variance   Positioning:  Cross cradle Right breast  LATCH documentation:  Latch:  2 = Grasps breast easily, tongue down, lips flanged, rhythmical sucking.  Audible swallowing:  1 = A few with stimulation  Type of nipple:  2 = Everted at rest and after stimulation  Comfort (Breast/Nipple):  2 = Soft / non-tender  Hold (Positioning):  1 = Assistance needed to correctly position infant at breast and maintain latch  LATCH score:  8  Attached assessment:  Shallow  Lips flanged:  No.  Lips untucked:  Yes.    Suck assessment:  Displays both  Tools:  Nipple shield 20 mm Instructed on use and cleaning of tool:  Yes.    Pre-feed weight:  3342 g  (7 lb. 5.9 oz.) Post-feed weight:  3356 g Amount transferred:  14 ml Amount supplemented:  0 ml  Additional Feeding Assessment -   Infant's oral assessment:  Variance  Positioning:  Cross cradle Left breast  LATCH documentation:  Latch:  1 = Repeated attempts needed to sustain latch, nipple held in mouth throughout feeding, stimulation needed to elicit sucking reflex.  Audible swallowing:  1 = A few with stimulation  Type of nipple:  2 = Everted at rest and after stimulation  Comfort (Breast/Nipple):  2 = Soft / non-tender  Hold (Positioning):  1 = Assistance needed to correctly position infant at breast and maintain latch  LATCH score:  7  Attached assessment:  Shallow  Lips flanged:  No.  Lips untucked:  Yes.    Suck assessment:  Displays both  Tools:  Nipple shield 20 mm , 5 french & double SNS Instructed on use and cleaning of tool:  Yes.    Pre-feed weight:  3356 g  Post-feed weight:  3364 g (7 lb. 6.1 oz.) Amount transferred:  10 ml Amount supplemented:  22 ml   Total amount transferred:  10 ml Total supplement given:  22 ml  LC very concerned about  baby.  Mother taking Mag Sulfate and Zyrtec with borderline history of PCOS.  Mother has allergy to coconut oil and latex.  Baby has had more weight loss and has had only 2 stools in 7 days, has been sleepy at the breast during consult.  Mother states baby has been more active at home.  Baby fell asleep qucikly after crying on scale.  Family had scheduled Ped appt directly after consult.  LC called and spoke with MD to share concern about this baby.  Baby is taking 45-1 hour to breastfeed at home and during consult only transferred 10 ml.   LC noted short mid posterior frenulum contributing to cupped notched tongue.  Noted baby chomping while breastfeeding and minimal swallows while using #20NS.  Provided family with resources to have infant evaluated.  Baby does not have tight seal when breastfeeding or bottle feeding.  Attempted to stimulate baby using 5 french with nipple shield and Double SNS.  Baby took some volume but it was slow. Mother is post pumping after every feeding.  States milk transitioned on day 5 but has only been able to pump a maximum of 20 ml.  LC assisted with Bottle feeding pumped breastmilk after breastfeeding but baby only took approx 22 ml total.   Plan:  Hand express before feeding and after pumping. Latch baby first but only up to 30 min. Supplement after each feeding with 45-60 ml.  Use slow flow bottle if he will take it.  If not go back to syringe feeding with 5 french tubing to get volume in baby. Give breastmilk first with the difference with formula. Increase volume 5-10 ml per day. 1-3 times per day use SNS. Post pump 4-6 times per day and give baby back volume pumped at next feeding. Encouraged fenugreek and hands on pumping. Monitor voids/stools.   Suggest oral assessment. Call MD with any feeding concerns.

## 2017-02-10 IMAGING — RF DG HYSTEROGRAM
7 series · 7 of 7 positions shown · IV contrast (omnipaque)
Comparison: None.

CLINICAL DATA: Procreative management counseling.

EXAM:
HYSTEROSALPINGOGRAM
TECHNIQUE: Following cleansing of the cervix and vagina with Betadine solution,
a hysterosalpingogram was performed using a 5-French
hysterosalpingogram catheter and Omnipaque 300 contrast. The patient
tolerated the examination without difficulty.

[Series 1: run · 1 of 1 slices shown (1 of 7)]
[im 1/1]
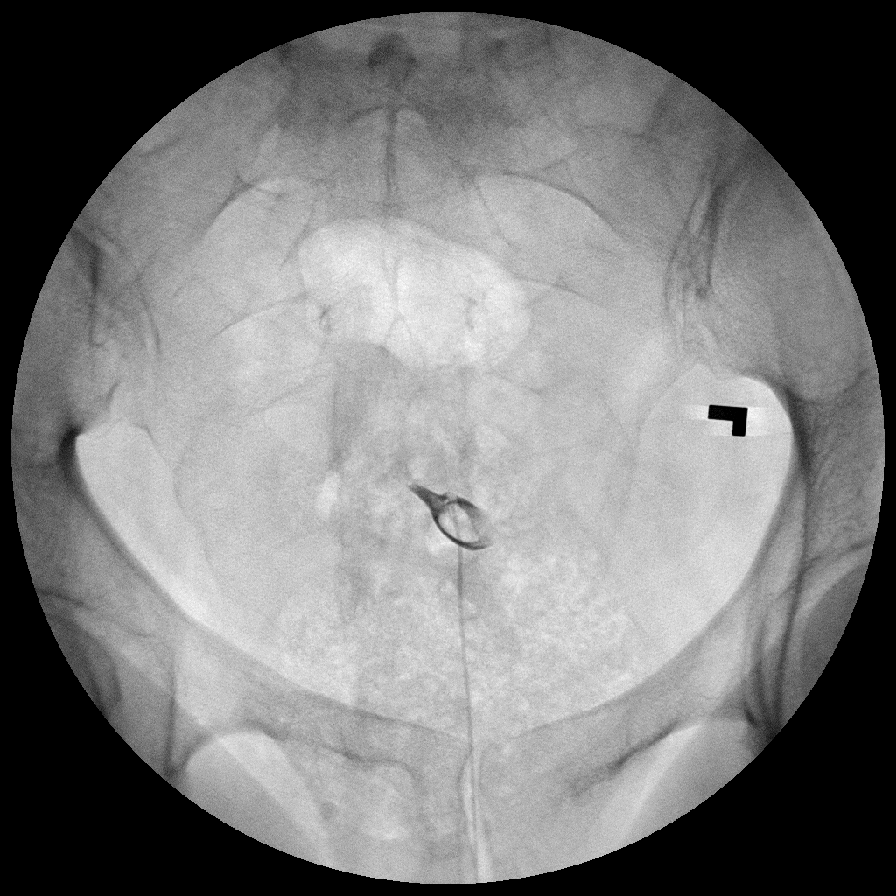

[Series 2: run · 1 of 1 slices shown (2 of 7)]
[im 1/1]
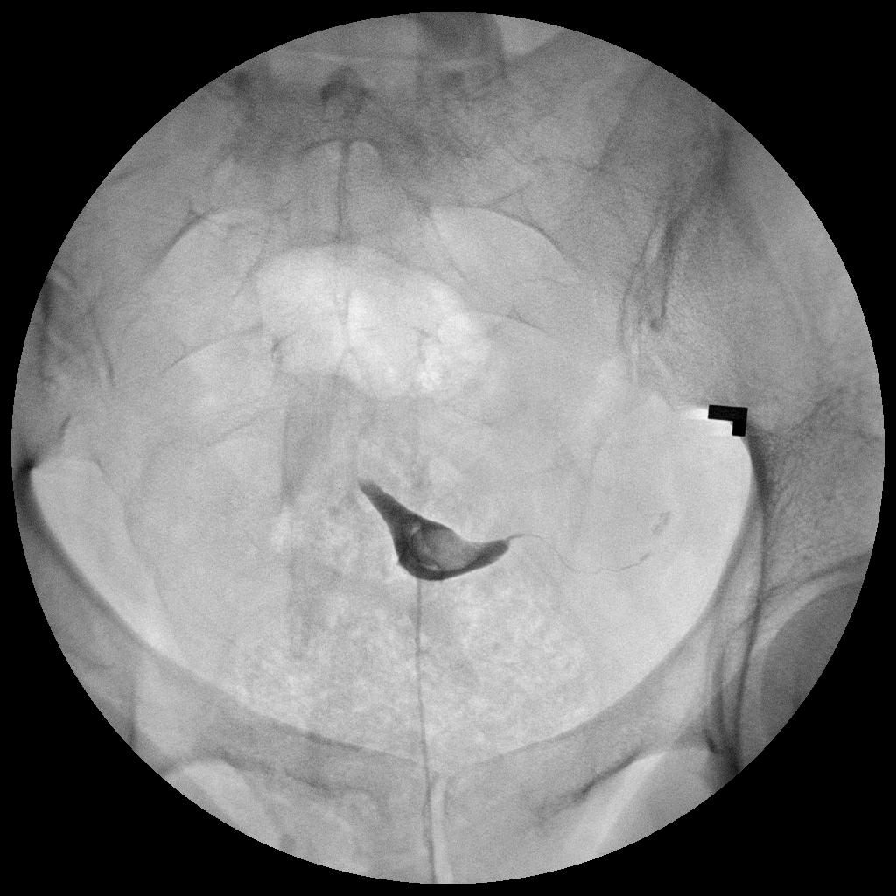

[Series 3: run · 1 of 1 slices shown (3 of 7)]
[im 1/1]
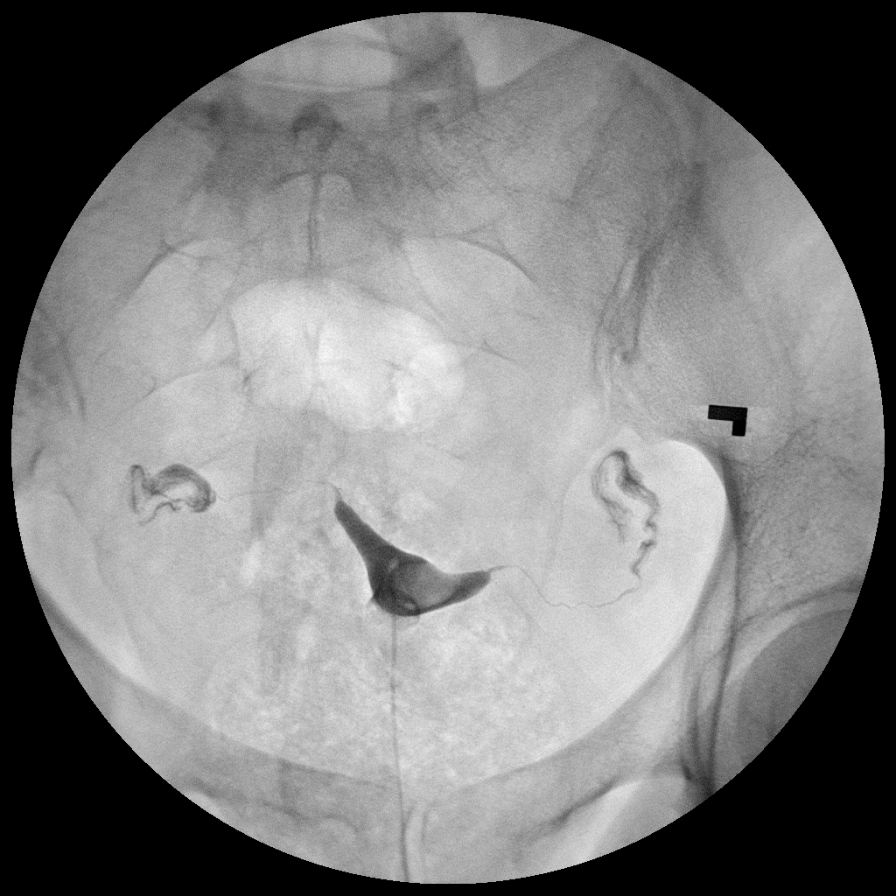

[Series 4: run · 1 of 1 slices shown (4 of 7)]
[im 1/1]
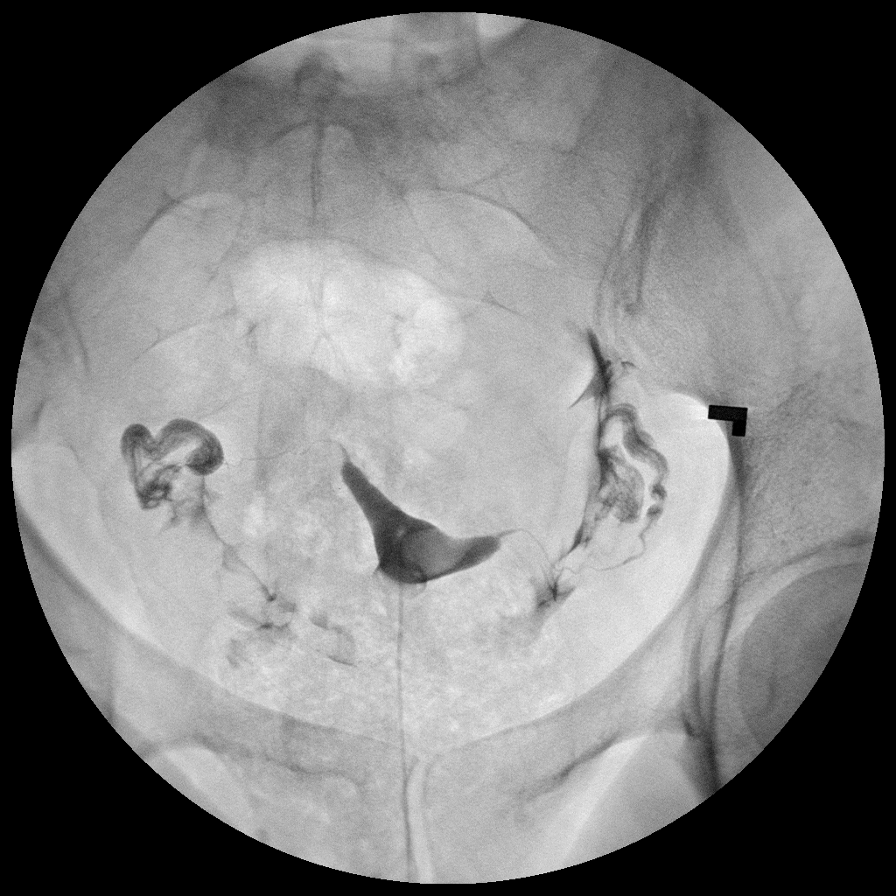

[Series 5: run · 1 of 1 slices shown (5 of 7)]
[im 1/1]
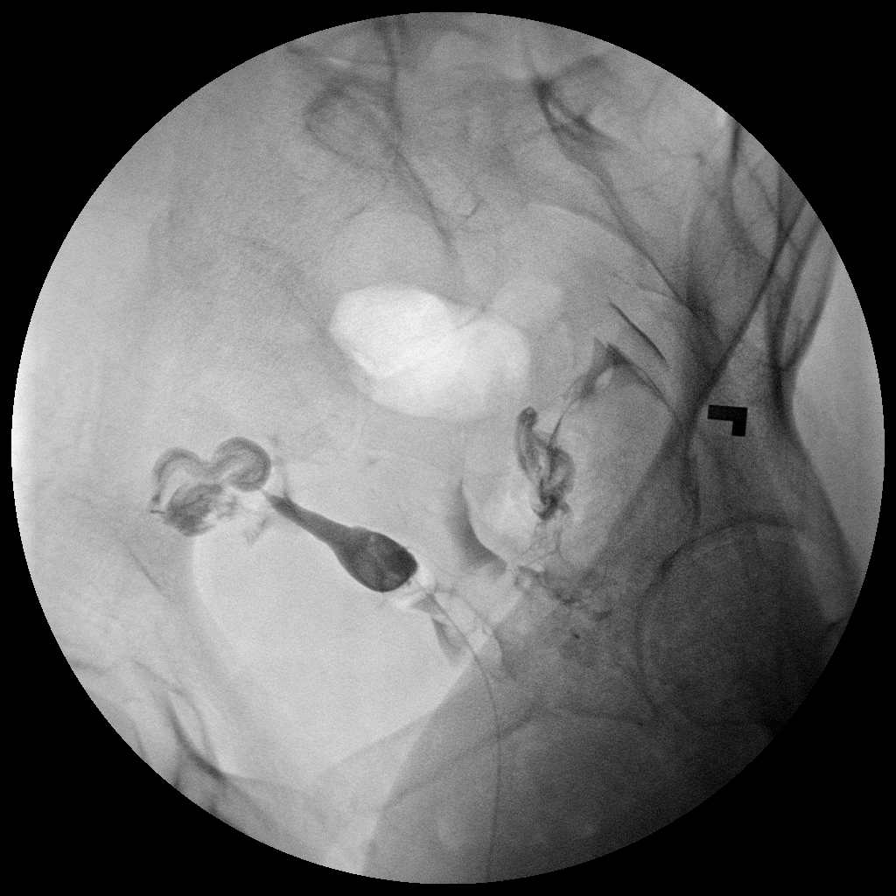

[Series 6: run · 1 of 1 slices shown (6 of 7)]
[im 1/1]
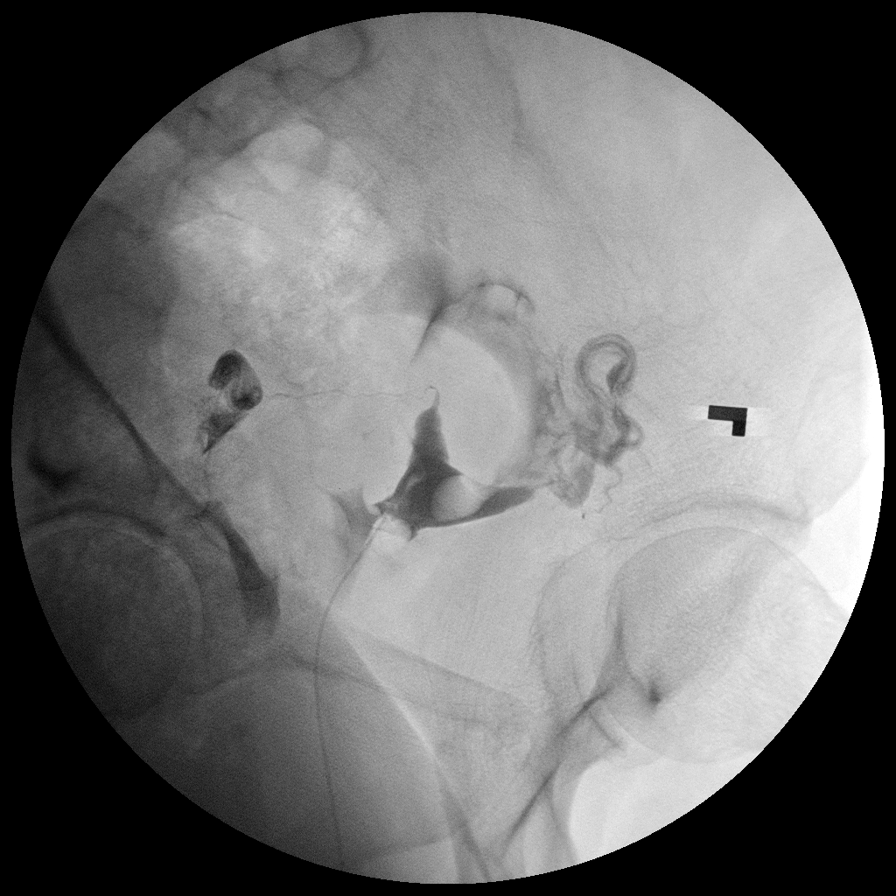

[Series 7: run · 1 of 1 slices shown (7 of 7)]
[im 1/1]
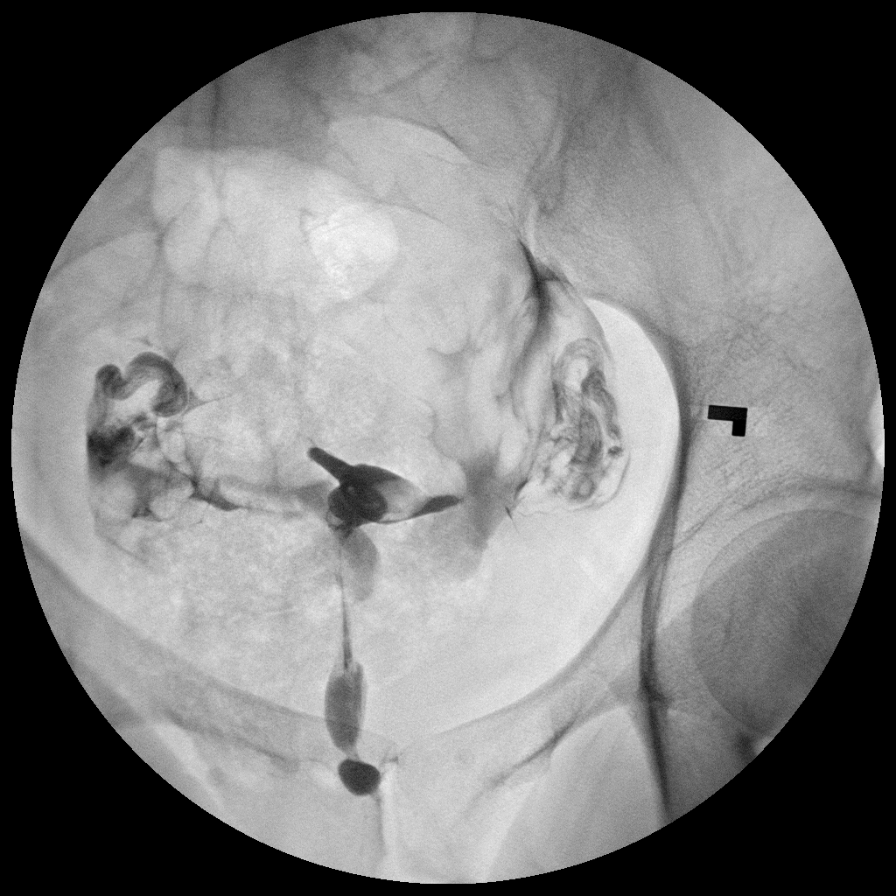

[7 of 7 positions shown; findings below may reference images not displayed]

FLUOROSCOPY TIME:  Fluoroscopy Time:  1 minutes 6 seconds

Number of Acquired Images:  7
FINDINGS: There is a persistent circumscribed ovoid filling defect in the left
fundal uterine cavity occupying approximately [DATE] of the endometrial
cavity. Otherwise normal endometrial cavity contour with no
additional endometrial cavity filling defects.

Both fallopian tubes are normal caliber and opacify promptly and in
their entirety, with normal spillage of contrast from the fimbriated
ends of both fallopian tubes bilaterally and with normal dispersal
of contrast within the peritoneal cavity bilaterally.
IMPRESSION: 1. Normal patent fallopian tubes bilaterally.
2. Circumscribed ovoid filling defect in the left fundal uterine
cavity occupying approximately [DATE] of the endometrial cavity.
Primary differential considerations are an intracavitary fibroid or
endometrial polyp. Consider correlation with transabdominal and
transvaginal pelvic ultrasound, hysterosonography and/or
hysteroscopy, as clinically warranted.

## 2017-04-25 ENCOUNTER — Telehealth: Payer: Self-pay | Admitting: Genetics

## 2017-04-25 ENCOUNTER — Encounter: Payer: Self-pay | Admitting: Genetics

## 2017-04-25 NOTE — Telephone Encounter (Signed)
Genetic counseling appt has been scheduled for the pt to see Ria Comment on 12/31 at 3pm. Pt aware to arrive 30 minutes early. Address and insurance verified. Letter mailed to the pt and faxed to the referring.

## 2017-06-06 ENCOUNTER — Other Ambulatory Visit: Payer: Self-pay

## 2017-06-06 ENCOUNTER — Encounter: Payer: Self-pay | Admitting: Genetics

## 2017-08-10 ENCOUNTER — Other Ambulatory Visit: Payer: Self-pay

## 2017-08-10 ENCOUNTER — Encounter: Payer: Self-pay | Admitting: Genetic Counselor

## 2017-09-28 ENCOUNTER — Inpatient Hospital Stay: Payer: 59 | Attending: Genetic Counselor | Admitting: Genetic Counselor

## 2017-09-28 ENCOUNTER — Inpatient Hospital Stay: Payer: 59
